# Patient Record
Sex: Female | Born: 1975 | Race: White | Hispanic: No | Marital: Single | Smoking: Never smoker
Health system: Southern US, Community
[De-identification: ages and names within clinical notes are randomized; demographics above are authoritative.]

## PROBLEM LIST (undated history)

## (undated) DIAGNOSIS — F419 Anxiety disorder, unspecified: Secondary | ICD-10-CM

## (undated) HISTORY — PX: FOOT SURGERY: SHX648

## (undated) HISTORY — PX: ABDOMINAL HYSTERECTOMY: SHX81

## (undated) HISTORY — PX: KNEE SURGERY: SHX244

---

## 2014-12-13 ENCOUNTER — Emergency Department (HOSPITAL_COMMUNITY)
Admission: EM | Admit: 2014-12-13 | Discharge: 2014-12-13 | Disposition: A | Discharge status: Home / Self Care | Payer: Self-pay | Attending: Emergency Medicine | Admitting: Emergency Medicine

## 2014-12-13 ENCOUNTER — Emergency Department (HOSPITAL_COMMUNITY): Payer: Self-pay

## 2014-12-13 ENCOUNTER — Encounter (HOSPITAL_COMMUNITY): Payer: Self-pay | Admitting: *Deleted

## 2014-12-13 DIAGNOSIS — F419 Anxiety disorder, unspecified: Secondary | ICD-10-CM | POA: Insufficient documentation

## 2014-12-13 DIAGNOSIS — R079 Chest pain, unspecified: Secondary | ICD-10-CM | POA: Insufficient documentation

## 2014-12-13 DIAGNOSIS — Z79899 Other long term (current) drug therapy: Secondary | ICD-10-CM | POA: Insufficient documentation

## 2014-12-13 HISTORY — DX: Anxiety disorder, unspecified: F41.9

## 2014-12-13 LAB — BASIC METABOLIC PANEL
Anion gap: 5 (ref 5–15)
BUN: 11 mg/dL (ref 6–20)
CHLORIDE: 109 mmol/L (ref 101–111)
CO2: 22 mmol/L (ref 22–32)
CREATININE: 0.71 mg/dL (ref 0.44–1.00)
Calcium: 8.1 mg/dL — ABNORMAL LOW (ref 8.9–10.3)
GFR calc non Af Amer: >60 mL/min (ref >60)
Glucose, Bld: 97 mg/dL (ref 65–99)
POTASSIUM: 5.2 mmol/L — AB (ref 3.5–5.1)
SODIUM: 136 mmol/L (ref 135–145)

## 2014-12-13 LAB — CBC
HEMATOCRIT: 37.4 % (ref 36.0–46.0)
Hemoglobin: 12.6 g/dL (ref 12.0–15.0)
MCH: 29.9 pg (ref 26.0–34.0)
MCHC: 33.7 g/dL (ref 30.0–36.0)
MCV: 88.6 fL (ref 78.0–100.0)
Platelets: 33 10*3/uL — ABNORMAL LOW (ref 150–400)
RBC: 4.22 MIL/uL (ref 3.87–5.11)
RDW: 12.7 % (ref 11.5–15.5)
WBC: 2.2 10*3/uL — AB (ref 4.0–10.5)

## 2014-12-13 LAB — I-STAT TROPONIN, ED: Troponin i, poc: 0 ng/mL (ref 0.00–0.08)

## 2014-12-13 MED ORDER — ACETAMINOPHEN 500 MG PO TABS
1000.0000 mg | ORAL_TABLET | Freq: Once | ORAL | Status: AC
  Administered 2014-12-13: 1000 mg via ORAL
  Filled 2014-12-13: qty 2

## 2014-12-13 MED ORDER — ASPIRIN 81 MG PO CHEW
324.0000 mg | CHEWABLE_TABLET | Freq: Once | ORAL | Status: AC
  Administered 2014-12-13: 324 mg via ORAL
  Filled 2014-12-13: qty 4

## 2014-12-13 NOTE — ED Provider Notes (Signed)
CSN: [id number]     Arrival date & time 12/13/14  [id number] History   First MD Initiated Contact with Patient 12/13/14 [id number]     Chief Complaint  Patient presents with  . Chest Pain     (Consider location/radiation/quality/duration/timing/severity/associated sxs/prior Treatment) Patient is a 39 y.o. female presenting with chest pain. The history is provided by the patient.  Chest Pain Pain location:  Substernal area and L chest Pain quality: pressure   Pain radiates to:  Does not radiate Pain radiates to the back: no   Pain severity:  Mild Onset quality:  Sudden Duration: 5. Timing:  Constant Progression:  Unchanged Chronicity:  New Context: at rest   Relieved by:  Nothing Worsened by:  Nothing tried Ineffective treatments:  None tried Associated symptoms: no abdominal pain, no cough, no fever and no shortness of breath   Risk factors: no high cholesterol, no hypertension and no smoking     Past Medical History  Diagnosis Date  . Anxiety    Past Surgical History  Procedure Laterality Date  . Foot surgery    . Knee surgery    . Abdominal hysterectomy    . Cesarean section     No family history on file. Social History  Substance Use Topics  . Smoking status: Never Smoker   . Smokeless tobacco: None  . Alcohol Use: No   OB History    No data available     Review of Systems  Constitutional: Negative for fever.  Respiratory: Negative for cough and shortness of breath.   Cardiovascular: Positive for chest pain.  Gastrointestinal: Negative for abdominal pain.  All other systems reviewed and are negative.     Allergies  Morphine and related and Prednisone  Home Medications   Prior to Admission medications   Medication Sig Start Date End Date Taking? Authorizing Provider  alprazolam Prudy Feeler(XANAX) 2 MG tablet Take 2 mg by mouth 2 (two) times daily as needed for sleep or anxiety.   Yes Historical Provider, MD  Asenapine Maleate (SAPHRIS) 10 MG SUBL Place 1 tablet under  the tongue daily.   Yes Historical Provider, MD  lamoTRIgine (LAMICTAL) 100 MG tablet Take 100 mg by mouth daily.   Yes Historical Provider, MD   BP 115/67 mmHg  Pulse 95  Temp(Src) 97.6 F (36.4 C) (Oral)  Resp 18  SpO2 99% Physical Exam  Constitutional: She is oriented to person, place, and time. She appears well-developed and well-nourished. No distress.  HENT:  Head: Normocephalic.  Eyes: Conjunctivae are normal.  Neck: Neck supple. No tracheal deviation present.  Cardiovascular: Normal rate, regular rhythm and normal heart sounds.   Pulmonary/Chest: Effort normal and breath sounds normal. No respiratory distress. She has no wheezes. She has no rales. She exhibits tenderness.  Abdominal: Soft. She exhibits no distension.  Neurological: She is alert and oriented to person, place, and time.  Skin: Skin is warm and dry.  Psychiatric: She has a normal mood and affect.    ED Course  Procedures (including critical care time) Labs Review Labs Reviewed  BASIC METABOLIC PANEL - Abnormal; Notable for the following:    Potassium 5.2 (*)    Calcium 8.1 (*)    All other components within normal limits  CBC - Abnormal; Notable for the following:    WBC 2.2 (*)    Platelets 33 (*)    All other components within normal limits  Rosezena SensorI-STAT TROPOININ, ED    Imaging Review Dg Chest 2 View  12/13/2014  CLINICAL DATA:  Mid sternal chest pain. EXAM: CHEST - 2 VIEW COMPARISON:  None FINDINGS: The heart size and mediastinal contours are within normal limits. There is no evidence of pulmonary edema, consolidation, pneumothorax, nodule or pleural fluid. The visualized skeletal structures are unremarkable. IMPRESSION: No active disease. Electronically Signed   By: Irish Lack M.D.   On: 12/13/2014 07:46   I have personally reviewed and evaluated these images and lab results as part of my medical decision-making.   EKG Interpretation   Date/Time:  Friday December 13 2014 06:56:52  EDT Ventricular Rate:  93 PR Interval:  125 QRS Duration: 88 QT Interval:  365 QTC Calculation: 454 R Axis:   78 Text Interpretation:  Sinus rhythm No old tracing to compare Confirmed by  Mirian Mo [phone]) on 12/13/2014 7:03:37 AM      MDM   Final diagnoses:  Chest pain, unspecified chest pain type    39 year old female presents with left sided atypical, reproducible chest pain that she woke up with at 3 AM today. She is PERC, negative, heart score is 1 and troponin is negative. Doubt ACS. Patient asking about refills of medication. No emergent indication currently for Xanax or Lamictal. [facility] for [facility] care.    Lyndal Pulley, MD 12/13/14 [phone]

## 2014-12-13 NOTE — Discharge Instructions (Signed)
Chest Wall Pain °Chest wall pain is pain in or around the bones and muscles of your chest. Sometimes, an injury causes this pain. Sometimes, the cause may not be known. This pain may take several weeks or longer to get better. °HOME CARE °Pay attention to any changes in your symptoms. Take these actions to help with your pain: °· Rest as told by your doctor. °· Avoid activities that cause pain. Try not to use your chest, belly (abdominal), or side muscles to lift heavy things. °· If directed, apply ice to the painful area: °¨ Put ice in a plastic bag. °¨ Place a towel between your skin and the bag. °¨ Leave the ice on for 20 minutes, 2-3 times per day. °· Take over-the-counter and prescription medicines only as told by your doctor. °· Do not use tobacco products, including cigarettes, chewing tobacco, and e-cigarettes. If you need help quitting, ask your doctor. °· Keep all follow-up visits as told by your doctor. This is important. °GET HELP IF: °· You have a fever. °· Your chest pain gets worse. °· You have new symptoms. °GET HELP RIGHT AWAY IF: °· You feel sick to your stomach (nauseous) or you throw up (vomit). °· You feel sweaty or light-headed. °· You have a cough with phlegm (sputum) or you cough up blood. °· You are short of breath. °  °This information is not intended to replace advice given to you by your health care provider. Make sure you discuss any questions you have with your health care provider. °  °Document Released: 07/28/2007 Document Revised: 10/30/2014 Document Reviewed: 05/06/2014 °Elsevier Interactive Patient Education ©2016 [employer] ° °

## 2014-12-13 NOTE — ED Notes (Signed)
Pt states that she woke up at 3am with midsternal to left sided chest pain; pt describes the feeling as a squeezing sensation; pt states that she feels dizzy and has a headache but denies any other sx; pt states that she has been anxious but has never been awakened from sleep with pain; denies shortness of breath; pt hyperventilating at times in triage

## 2015-02-18 ENCOUNTER — Emergency Department (HOSPITAL_COMMUNITY)
Admission: EM | Admit: 2015-02-18 | Discharge: 2015-02-18 | Disposition: A | Discharge status: Home / Self Care | Payer: Self-pay

## 2015-02-18 NOTE — ED Notes (Signed)
No answer x3 in waiting room.

## 2015-02-18 NOTE — ED Notes (Signed)
Unable to locate pt. at triage/waiting area .

## 2015-02-19 ENCOUNTER — Encounter (HOSPITAL_COMMUNITY): Payer: Self-pay | Admitting: Emergency Medicine

## 2015-02-19 ENCOUNTER — Emergency Department (HOSPITAL_COMMUNITY)
Admission: EM | Admit: 2015-02-19 | Discharge: 2015-02-19 | Disposition: A | Discharge status: Home / Self Care | Payer: Self-pay | Attending: Emergency Medicine | Admitting: Emergency Medicine

## 2015-02-19 DIAGNOSIS — Z79899 Other long term (current) drug therapy: Secondary | ICD-10-CM | POA: Insufficient documentation

## 2015-02-19 DIAGNOSIS — M545 Low back pain, unspecified: Secondary | ICD-10-CM

## 2015-02-19 DIAGNOSIS — F419 Anxiety disorder, unspecified: Secondary | ICD-10-CM | POA: Insufficient documentation

## 2015-02-19 DIAGNOSIS — M25551 Pain in right hip: Secondary | ICD-10-CM | POA: Insufficient documentation

## 2015-02-19 MED ORDER — KETOROLAC TROMETHAMINE 60 MG/2ML IM SOLN
60.0000 mg | Freq: Once | INTRAMUSCULAR | Status: AC
Start: 2015-02-19 — End: 2015-02-19
  Administered 2015-02-19: 60 mg via INTRAMUSCULAR
  Filled 2015-02-19: qty 2

## 2015-02-19 MED ORDER — DEXAMETHASONE SODIUM PHOSPHATE 10 MG/ML IJ SOLN
10.0000 mg | Freq: Once | INTRAMUSCULAR | Status: AC
Start: 2015-02-19 — End: 2015-02-19
  Administered 2015-02-19: 10 mg via INTRAMUSCULAR
  Filled 2015-02-19: qty 1

## 2015-02-19 NOTE — ED Notes (Signed)
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 Has had LBP since then. Radiates down right leg.

## 2015-02-19 NOTE — ED Provider Notes (Signed)
CSN: [id number]     Arrival date & time 02/19/15  1442 History  By signing my name below, I, Phillis HaggisGabriella Gaje, attest that this documentation has been prepared under the direction and in the presence of Joycie PeekBenjamin Duston Smolenski, PA-C Electronically Signed: Phillis HaggisGabriella Gaje, ED Scribe. 02/19/2015. 4:24 PM.   No chief complaint on file. The history is provided by the patient. No language interpreter was used.   HPI Comments: Tracey PatersonRoxanne Moreno is a 39 y.o. female who presents to the Emergency Department complaining of dull, burning, constant, gradually worsening lower back and right gluteal pain onset 2 days ago. Pt states that the pain has been radiating down her right leg and has a hx of sciatic nerve pain. 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 She reports worsening pain with movement. She states that she has been taking Tylenol and Aleve to no relief. She denies bladder or bowel incontinence, numbness, or weakness. No fevers, chills, night sweats, IV drug use. Pt reports allergies to oral prednisone; she states that it causes her BP to elevate.    Past Medical History  Diagnosis Date  . Anxiety    Past Surgical History  Procedure Laterality Date  . Foot surgery    . Knee surgery    . Abdominal hysterectomy    . Cesarean section     No family history on file. Social History  Substance Use Topics  . Smoking status: Never Smoker   . Smokeless tobacco: None  . Alcohol Use: No   OB History    No data available     Review of Systems  Musculoskeletal: Positive for back pain and arthralgias.  Neurological: Negative for weakness and numbness.  All other systems reviewed and are negative.  Allergies  Morphine and related and Prednisone  Home Medications   Prior to Admission medications   Medication Sig Start Date End Date Taking? Authorizing Provider  alprazolam Prudy Feeler(XANAX) 2 MG tablet Take 2 mg by mouth 2 (two) times daily as needed for sleep or anxiety.     Historical Provider, MD  Asenapine Maleate (SAPHRIS) 10 MG SUBL Place 1 tablet under the tongue daily.    Historical Provider, MD  lamoTRIgine (LAMICTAL) 100 MG tablet Take 100 mg by mouth daily.    Historical Provider, MD   BP 110/71 mmHg  Pulse 105  Temp(Src) 97.9 F (36.6 C) (Oral)  Resp 20  Ht 5\' 4"  (1.626 m)  Wt 72.576 kg  BMI 27.45 kg/m2  SpO2 100% Physical Exam  Constitutional: She is oriented to person, place, and time. She appears well-developed and well-nourished. No distress.  HENT:  Head: Normocephalic and atraumatic.  Eyes: EOM are normal.  Neck: Normal range of motion.  Cardiovascular: Normal rate, regular rhythm and normal heart sounds.   Pulmonary/Chest: Effort normal and breath sounds normal.  Abdominal: Soft. She exhibits no distension. There is no tenderness.  Musculoskeletal: Normal range of motion.  Tenderness over superior gluteal musculature and right hip bursa. No other lesions or deformities, redness or swelling.  Neurological: She is alert and oriented to person, place, and time.  Gait is somewhat antalgic with no ataxia; motor strength 5/5 in all extremities; sensation intact to light touch in all extremities. Negative straight leg raise.  Skin: Skin is warm and dry.  Psychiatric: She has a normal mood and affect. Judgment normal.  Nursing note and vitals reviewed.   ED Course  Procedures (including critical care time) DIAGNOSTIC STUDIES: Oxygen Saturation is 100%  on RA, normal by my interpretation.    COORDINATION OF CARE: 4:24 PM-Discussed treatment plan which includes anti-inflammatories with pt at bedside and pt agreed to plan.    Labs Review Labs Reviewed - No data to display  Imaging Review No results found. I have personally reviewed and evaluated these images and lab results as part of my medical decision-making.   EKG Interpretation None      Medications  ketorolac (TORADOL) injection 60 mg (60 mg Intramuscular Given 02/19/15  1633)  dexamethasone (DECADRON) injection 10 mg (10 mg Intramuscular Given 02/19/15 1633)   Filed Vitals:   02/19/15 1453  BP: 110/71  Pulse: 105  Temp: 97.9 F (36.6 C)  Resp: 20    MDM  Tracey Moreno is a 38 y.o. female Patient with back pain.  No neurological deficits and normal neuro exam.  Patient can walk but states is painful.  No loss of bowel or bladder control.  No concern for cauda equina. Likely lumbosacral strain versus bursitis. Treated in the ED with steroid injection, and said. No fever, night sweats, weight loss, h/o cancer, IVDU.  RICE protocol as well as continued use of Aleve at home discussed with patient. Patient verbalizes understanding and agrees with this plan. Discussed follow-up with PCP as needed.  Overall, patient is well, nontoxic, hemodynamically stable and appropriate for discharge. There is no tachycardia on my exam, heart rate was mid 90s. Suspect original heart rate was due to activity versus pain.  The patient appears reasonably screened and/or stabilized for discharge and I doubt any other medical condition or other [facility] requiring further screening, evaluation, or treatment in the ED at this time prior to discharge.    Final diagnoses:  Right-sided low back pain without sciatica    I personally performed the services described in this documentation, which was scribed in my presence. The recorded information has been reviewed and is accurate.    Joycie Peek, PA-C 02/19/15 1702  Rolland Porter, MD 02/28/15 [phone]

## 2015-02-19 NOTE — Discharge Instructions (Signed)
Please take your medications as prescribed. Her symptoms should improve over the next couple of weeks. Please follow-up with your doctor for reevaluation as needed. Continue using your Aleve as we discussed. Return to ED for any new or worsening symptoms including loss of bowel or bladder function, numbness or weakness.  Back Pain, Adult Back pain is very common in adults.The cause of back pain is rarely dangerous and the pain often gets better over time.The cause of your back pain may not be known. Some common causes of back pain include:  Strain of the muscles or ligaments supporting the spine.  Wear and tear (degeneration) of the spinal disks.  Arthritis.  Direct injury to the back. For many people, back pain may return. Since back pain is rarely dangerous, most people can learn to manage this condition on their own. HOME CARE INSTRUCTIONS Watch your back pain for any changes. The following actions may help to lessen any discomfort you are feeling:  Remain active. It is stressful on your back to sit or stand in one place for long periods of time. Do not sit, drive, or stand in one place for more than 30 minutes at a time. Take short walks on even surfaces as soon as you are able.Try to increase the length of time you walk each day.  Exercise regularly as directed by your health care provider. Exercise helps your back heal faster. It also helps avoid future injury by keeping your muscles strong and flexible.  Do not stay in bed.Resting more than 1-2 days can delay your recovery.  Pay attention to your body when you bend and lift. The most comfortable positions are those that put less stress on your recovering back. Always use proper lifting techniques, including:  Bending your knees.  Keeping the load close to your body.  Avoiding twisting.  Find a comfortable position to sleep. Use a firm mattress and lie on your side with your knees slightly bent. If you lie on your back, put a  pillow under your knees.  Avoid feeling anxious or stressed.Stress increases muscle tension and can worsen back pain.It is important to recognize when you are anxious or stressed and learn ways to manage it, such as with exercise.  Take medicines only as directed by your health care provider. Over-the-counter medicines to reduce pain and inflammation are often the most helpful.Your health care provider may prescribe muscle relaxant drugs.These medicines help dull your pain so you can more quickly return to your normal activities and healthy exercise.  Apply ice to the injured area:  Put ice in a plastic bag.  Place a towel between your skin and the bag.  Leave the ice on for 20 minutes, 2-3 times a day for the first 2-3 days. After that, ice and heat may be alternated to reduce pain and spasms.  Maintain a healthy weight. Excess weight puts extra stress on your back and makes it difficult to maintain good posture. SEEK MEDICAL CARE IF:  You have pain that is not relieved with rest or medicine.  You have increasing pain going down into the legs or buttocks.  You have pain that does not improve in one week.  You have night pain.  You lose weight.  You have a fever or chills. SEEK IMMEDIATE MEDICAL CARE IF:   You develop new bowel or bladder control problems.  You have unusual weakness or numbness in your arms or legs.  You develop nausea or vomiting.  You develop abdominal pain.  You feel faint.   This information is not intended to replace advice given to you by your health care provider. Make sure you discuss any questions you have with your health care provider.   Document Released: 02/08/2005 Document Revised: 03/01/2014 Document Reviewed: 06/12/2013 Elsevier Interactive Patient Education [employer].

## 2015-04-16 ENCOUNTER — Encounter (HOSPITAL_COMMUNITY): Payer: Self-pay | Admitting: *Deleted

## 2015-04-16 ENCOUNTER — Emergency Department (HOSPITAL_COMMUNITY)
Admission: EM | Admit: 2015-04-16 | Discharge: 2015-04-16 | Disposition: A | Discharge status: Home / Self Care | Payer: Self-pay | Attending: Emergency Medicine | Admitting: Emergency Medicine

## 2015-04-16 DIAGNOSIS — Z79899 Other long term (current) drug therapy: Secondary | ICD-10-CM | POA: Insufficient documentation

## 2015-04-16 DIAGNOSIS — M25561 Pain in right knee: Secondary | ICD-10-CM | POA: Insufficient documentation

## 2015-04-16 DIAGNOSIS — F419 Anxiety disorder, unspecified: Secondary | ICD-10-CM | POA: Insufficient documentation

## 2015-04-16 DIAGNOSIS — R21 Rash and other nonspecific skin eruption: Secondary | ICD-10-CM | POA: Insufficient documentation

## 2015-04-16 MED ORDER — PERMETHRIN 5 % EX CREA: TOPICAL_CREAM | CUTANEOUS | Status: AC

## 2015-04-16 MED ORDER — KETOROLAC TROMETHAMINE 60 MG/2ML IM SOLN
60.0000 mg | Freq: Once | INTRAMUSCULAR | Status: AC
  Administered 2015-04-16: 60 mg via INTRAMUSCULAR
  Filled 2015-04-16: qty 2

## 2015-04-16 MED ORDER — IBUPROFEN 800 MG PO TABS: 800.0000 mg | ORAL_TABLET | Freq: Three times a day (TID) | ORAL | Status: AC

## 2015-04-16 NOTE — ED Notes (Signed)
Pt placed into shorts 

## 2015-04-16 NOTE — ED Notes (Signed)
C/o rash onset 3 days ago statesw her husband was treated for same and c/o knee pain onset yest.

## 2015-04-16 NOTE — ED Provider Notes (Signed)
CSN: [id number]     Arrival date & time 04/16/15  4098 History   First MD Initiated Contact with Patient 04/16/15 [phone]     Chief Complaint  Patient presents with  . Rash  . Knee Pain     (Consider location/radiation/quality/duration/timing/severity/associated sxs/prior Treatment) HPI Comments: The patient is a 40 year old female, she has a history of chronic right patellar dislocations, states that she underwent surgery over one [place] area. She has recently moved here, she states that she was squatting, she tried to stand up, had some pain in the right knee with some deformity. This reduced spontaneously. She also states that she was walking down a ladder and had similar symptoms. At this time she states that her knee appears normal but has pain radiating from the knee to the foot. This is an intermittently chronic problem for her. She also complains of a diffuse itchy rash similar to what her significant other has, she is unsure what it is but is concerned about scabies. His been present for 2 days.  Patient is a 40 y.o. female presenting with rash and knee pain. The history is provided by the patient.  Rash Associated symptoms: joint pain   Knee Pain   Past Medical History  Diagnosis Date  . Anxiety    Past Surgical History  Procedure Laterality Date  . Foot surgery    . Knee surgery    . Abdominal hysterectomy    . Cesarean section     No family history on file. Social History  Substance Use Topics  . Smoking status: Never Smoker   . Smokeless tobacco: None  . Alcohol Use: No   OB History    No data available     Review of Systems  Musculoskeletal: Positive for arthralgias.  Skin: Positive for rash.      Allergies  Morphine and related and Prednisone  Home Medications   Prior to Admission medications   Medication Sig Start Date End Date Taking? Authorizing Provider  alprazolam Prudy Feeler) 2 MG tablet Take 2 mg by mouth 2 (two) times daily as  needed for sleep or anxiety.    Historical Provider, MD  Asenapine Maleate (SAPHRIS) 10 MG SUBL Place 1 tablet under the tongue daily.    Historical Provider, MD  ibuprofen (ADVIL,MOTRIN) 800 MG tablet Take 1 tablet (800 mg total) by mouth 3 (three) times daily. 04/16/15   Eber Hong, MD  lamoTRIgine (LAMICTAL) 100 MG tablet Take 100 mg by mouth daily.    Historical Provider, MD  permethrin (ELIMITE) 5 % cream Apply to entire body other than face - let sit for 14 hours then wash off, may repeat in 1 week if still having symptoms 04/16/15   Eber Hong, MD   BP 123/80 mmHg  Pulse 92  Temp(Src) 98 F (36.7 C) (Oral)  Resp 18  SpO2 100% Physical Exam  Constitutional: She appears well-developed and well-nourished.  HENT:  Head: Normocephalic and atraumatic.  Eyes: Conjunctivae are normal. Right eye exhibits no discharge. Left eye exhibits no discharge.  Pulmonary/Chest: Effort normal. No respiratory distress.  Musculoskeletal:  Full range of motion of the right knee, joint is supple, patellar seems to be midline, no pain at this time, full range motion of the ankle as well. She has normal strength in all major muscle groups of the right lower extremity.  Neurological: She is alert. Coordination normal.  Skin: Skin is warm and dry. Rash (diffuse scabbed over rash which  she states is pruritic, no signs of cellulitis petechiae or purpura) noted. She is not diaphoretic. No erythema.  Psychiatric: She has a normal mood and affect.  Nursing note and vitals reviewed.   ED Course  Procedures (including critical care time) Labs Review Labs Reviewed - No data to display  Imaging Review No results found. I have personally reviewed and evaluated these images and lab results as part of my medical decision-making.   EKG Interpretation None      MDM   Final diagnoses:  Rash  Knee pain, acute, right    Despite the finding of no intertriginous rash I suspect that she does have scabies  given her significant other with similar symptoms. She has chronic patellar dislocation, there is no indication for imaging at this time, she appears to have normal anatomy, she will be immobilized in a long-leg immobilizer, anti-inflammatories, home with orthopedic follow-up, she has expressed her understanding to the verbal instructions and is stable for discharge.  Meds given in ED:  Medications  ketorolac (TORADOL) injection 60 mg (60 mg Intramuscular Given 04/16/15 0823)    New Prescriptions   IBUPROFEN (ADVIL,MOTRIN) 800 MG TABLET    Take 1 tablet (800 mg total) by mouth 3 (three) times daily.   PERMETHRIN (ELIMITE) 5 % CREAM    Apply to entire body other than face - let sit for 14 hours then wash off, may repeat in 1 week if still having symptoms        Eber Hong, MD 04/16/15 [phone]

## 2015-04-16 NOTE — Discharge Instructions (Signed)
Apply the cream to your body today - leave on for 12 hours, then wash off

## 2015-06-07 DIAGNOSIS — R51 Headache: Secondary | ICD-10-CM | POA: Insufficient documentation

## 2015-06-08 ENCOUNTER — Emergency Department (HOSPITAL_COMMUNITY)
Admission: EM | Admit: 2015-06-08 | Discharge: 2015-06-08 | Disposition: A | Discharge status: Home / Self Care | Payer: Self-pay | Attending: Emergency Medicine | Admitting: Emergency Medicine

## 2015-06-08 ENCOUNTER — Emergency Department (HOSPITAL_COMMUNITY): Payer: Self-pay

## 2015-06-08 ENCOUNTER — Encounter (HOSPITAL_COMMUNITY): Payer: Self-pay | Admitting: Emergency Medicine

## 2015-06-08 NOTE — ED Notes (Signed)
Pt called from lobby to go to a treatment room, may be in [place]

## 2015-06-08 NOTE — ED Notes (Signed)
Pt c/o severe HA onset tonight @ 1800, +  Nausea. Pt states she does not normally have HA

## 2015-06-08 NOTE — ED Notes (Signed)
No answer x1

## 2015-06-29 ENCOUNTER — Encounter (HOSPITAL_COMMUNITY): Payer: Self-pay | Admitting: *Deleted

## 2015-06-29 ENCOUNTER — Emergency Department (HOSPITAL_COMMUNITY)
Admission: EM | Admit: 2015-06-29 | Discharge: 2015-06-29 | Disposition: A | Discharge status: Home / Self Care | Payer: Self-pay | Attending: Emergency Medicine | Admitting: Emergency Medicine

## 2015-06-29 ENCOUNTER — Emergency Department (HOSPITAL_COMMUNITY): Payer: Self-pay

## 2015-06-29 DIAGNOSIS — Y9389 Activity, other specified: Secondary | ICD-10-CM | POA: Insufficient documentation

## 2015-06-29 DIAGNOSIS — Y9289 Other specified places as the place of occurrence of the external cause: Secondary | ICD-10-CM | POA: Insufficient documentation

## 2015-06-29 DIAGNOSIS — F419 Anxiety disorder, unspecified: Secondary | ICD-10-CM | POA: Insufficient documentation

## 2015-06-29 DIAGNOSIS — Z79899 Other long term (current) drug therapy: Secondary | ICD-10-CM | POA: Insufficient documentation

## 2015-06-29 DIAGNOSIS — Y998 Other external cause status: Secondary | ICD-10-CM | POA: Insufficient documentation

## 2015-06-29 DIAGNOSIS — S8991XA Unspecified injury of right lower leg, initial encounter: Secondary | ICD-10-CM | POA: Insufficient documentation

## 2015-06-29 DIAGNOSIS — X501XXA Overexertion from prolonged static or awkward postures, initial encounter: Secondary | ICD-10-CM | POA: Insufficient documentation

## 2015-06-29 DIAGNOSIS — M25561 Pain in right knee: Secondary | ICD-10-CM

## 2015-06-29 MED ORDER — ACETAMINOPHEN 500 MG PO TABS
1000.0000 mg | ORAL_TABLET | Freq: Once | ORAL | Status: AC
  Administered 2015-06-29: 1000 mg via ORAL
  Filled 2015-06-29: qty 2

## 2015-06-29 NOTE — Discharge Instructions (Signed)
Community Resource Guide Social Services °The [employer]’s “211” is a great source of information about community services available.  Access by dialing 2-1-1 from anywhere in [place], or by website -  www.nc211.org.  ° °Other Local Resources (Updated 02/2015) ° °Social  °Services °  °Services °Offered  °Phone Number and Address  °Aging, Disability and Transit Services • In-home services °• Meals on wheels °• Community meal sites °• Transportation services °• Adult day health care °• [facility] °• Family caregiver support services 336-394-1310 °[place], [place]  °[place] [facility] • Aging and adult services °• Children’s services °• Deaf-Blind services °• Disability services °• Guardianship °• Hearing loss (assistive technology, interpreters, etc.) °• Low-income services (health care, child care, housing, financial and nutrition assistance) °• Medicaid °• Pregnancy services °• Utility assistance °• Veteran’s services 336-570-6532 °[address] °[place], [place] [address] °  °[facility] • Assessment °• Care management °• Family education °• Information and referral 336-538-8080 °[address] °[place], [place] [address]  °[facility] • Aging and adult services °• Children’s services °• Deaf-Blind services °• Disability services °• Guardianship °• Hearing loss (assistive technology, interpreters, etc.) °• Low-income services (health care, child care, housing, financial and nutrition assistance) °• Medicaid °• Pregnancy services °• Utility assistance °• Veteran’s services 336-694-4141 °[address] °[place], [place] [address]  °[place] [facility] • Aging and adult services °• Children’s services °• Deaf-Blind services °• Disability services °• Guardianship °• Hearing loss (assistive technology, interpreters, etc.) °• Low-income services (health care, child care, housing, financial and nutrition  assistance) °• Medicaid °• Pregnancy services °• Utility assistance °• Veteran’s services 336-703-3800 °[address] °[place], [place] [address]  °[place] [facility] • Aging and adult services °• Children’s services °• Deaf-Blind services °• Disability services °• Guardianship °• Hearing loss (assistive technology, interpreters, etc.) °• Low-income services (health care, child care, housing, financial and nutrition assistance) °• Medicaid °• Pregnancy services °• Utility assistance °• Veteran’s services 336-641-3000 °[address] °[place], [place] [address] °  °[place] [facility] • Aging and adult services °• Children’s services °• Deaf-Blind services °• Disability services °• Guardianship °• Hearing loss (assistive technology, interpreters, etc.) °• Low-income services (health care, child care, housing, financial and nutrition assistance) °• Medicaid °• Pregnancy services °• Utility assistance °• Veteran’s services 336-683-8000 °[address], °[place], [place] [address]  °[place] [facility] • Aging and adult services °• Children’s services °• Deaf-Blind services °• Disability services °• Guardianship °• Hearing loss (assistive technology, interpreters, etc.) °• Low-income services (health care, child care, housing, financial and nutrition assistance) °• Medicaid °• Pregnancy services °• Utility assistance °• Veteran’s services [phone] °[address] °[place], [place] [address]  °[facility] • Serves adults age 60 and over and their families °• Caregiver information °• Foster grandparents °• Grandparents Raising Grandchildren °• Mobile meals °• Refugee programs °• Retired & Senior Volunteer Program (RSVP) °• Rural Outreach °• Senior Center °• Seniors’ Health Insurance Information Program (SHIIP) °• Senior Wheels Medical Transportation Program °• SeniorLine °• Support Groups °• TeleCare  336-373-4816 ° °[address] °[place], [place] [address] ° °336-884-6981 ° °[address] °[place],  [address]  °[facility] • Help Line °• Home Care °• Living at Home °• Meals on Wheels °• Senior Lunch Program °• Adult Day Center °• Equipment Loan Closet °•   Support Groups °• Care Partner Workshops °• Referrals for chore and homemaker services 336-724-2040 ° °[address] °[place], [place] [address]  °[facility] [employer] • Crisis assistance °• Medication assistance °• Rental assistance °• Food pantry °• Medication assistance °• Housing assistance °• Emergency food distribution °• Utility assistance °• [employer] [facility] [employer] 336-222-5529 °[address] °[place], [place] ° ° °[phone] (Tuesdays and Thursdays from 9am - 12 noon by appointment only) °[address] °, [place] [address] ° °[phone] °[address] °[place], [place] [address]  ° ° ° °

## 2015-06-29 NOTE — ED Notes (Signed)
Pt left without discharge instructions and without signing e-signature. Pt left without letting staff know or getting discharge vitals.

## 2015-06-29 NOTE — ED Provider Notes (Signed)
CSN: [id number]     Arrival date & time 06/29/15  0141 History   By signing my name below, I, Arlan OrganAshley Leger, attest that this documentation has been prepared under the direction and in the presence of Azalia BilisKevin Shylo Zamor, MD.  Electronically Signed: Arlan OrganAshley Leger, ED Scribe. 06/29/2015. 2:55 AM.   Chief Complaint  Patient presents with  . Knee Pain  . Foot Pain   The history is provided by the patient. No language interpreter was used.    HPI Comments: Tracey Moreno is a 40 y.o. female without any pertinent past medical history who presents to the Emergency Department complaining of intermittent, ongoing, recurrent R knee pain x 2-3 days. No recent trauma, falls, or injury. However, pt states she occasionally "pops her knee back into place". She also reports L foot pain. No OTC medications or home remedies attempted prior to arrival. No recent fever, chills, nausea, vomiting, or abdominal pain. No numbness, loss of sensation, or weakness. Pt states [facility] but states she no longer has insurance and cannot cover the costs.  PCP: No primary care provider on file.    Past Medical History  Diagnosis Date  . Anxiety    Past Surgical History  Procedure Laterality Date  . Foot surgery    . Knee surgery    . Abdominal hysterectomy    . Cesarean section     No family history on file. Social History  Substance Use Topics  . Smoking status: Never Smoker   . Smokeless tobacco: Never Used  . Alcohol Use: No   OB History    No data available     Review of Systems  Constitutional: Negative for fever and chills.  Respiratory: Negative for cough and shortness of breath.   Gastrointestinal: Negative for nausea, vomiting and abdominal pain.  Musculoskeletal: Positive for arthralgias.  All other systems reviewed and are negative.     Allergies  Morphine and related and Prednisone  Home Medications   Prior to Admission medications   Medication Sig  Start Date End Date Taking? Authorizing Provider  alprazolam Prudy Feeler(XANAX) 2 MG tablet Take 2 mg by mouth 2 (two) times daily as needed for sleep or anxiety.    Historical Provider, MD  Asenapine Maleate (SAPHRIS) 10 MG SUBL Place 1 tablet under the tongue daily.    Historical Provider, MD  ibuprofen (ADVIL,MOTRIN) 800 MG tablet Take 1 tablet (800 mg total) by mouth 3 (three) times daily. 04/16/15   Eber HongBrian Miller, MD  lamoTRIgine (LAMICTAL) 100 MG tablet Take 100 mg by mouth daily.    Historical Provider, MD  permethrin (ELIMITE) 5 % cream Apply to entire body other than face - let sit for 14 hours then wash off, may repeat in 1 week if still having symptoms 04/16/15   Eber HongBrian Miller, MD   Triage Vitals: BP 121/82 mmHg  Pulse 101  Temp(Src) 98.1 F (36.7 C) (Oral)  Resp 20  Ht 5\' 4"  (1.626 m)  Wt 165 lb (74.844 kg)  BMI 28.31 kg/m2  SpO2 97%   Physical Exam  Constitutional: She is oriented to person, place, and time. She appears well-developed and well-nourished.  HENT:  Head: Normocephalic.  Eyes: EOM are normal.  Neck: Normal range of motion.  Pulmonary/Chest: Effort normal.  Abdominal: She exhibits no distension.  Musculoskeletal: Normal range of motion.  Small joint effusion to R knee FROM of R knee No erythema of R knee Normal pulses to R foot  Neurological:  She is alert and oriented to person, place, and time.  Psychiatric: She has a normal mood and affect.  Nursing note and vitals reviewed.   ED Course  Procedures (including critical care time)  DIAGNOSTIC STUDIES: Oxygen Saturation is 97% on RA, Normal by my interpretation.    COORDINATION OF CARE: 2:50 AM- Will order imaging. Will give Tylenol. Discussed treatment plan with pt at bedside and pt agreed to plan.     Labs Review Labs Reviewed - No data to display  Imaging Review Dg Knee Complete 4 Views Right  06/29/2015  CLINICAL DATA:  Ladder injury. EXAM: RIGHT KNEE - COMPLETE 4+ VIEW COMPARISON:  None. FINDINGS:  Negative for acute fracture dislocation. There is a knee joint effusion. There is no bone lesion or bony destruction. No significant arthritic changes are evident. IMPRESSION: Joint effusion.  Negative for acute fracture. Electronically Signed   By: Ellery Plunk M.D.   On: 06/29/2015 03:41   I have personally reviewed and evaluated these images and lab results as part of my medical decision-making.   EKG Interpretation None      MDM   Final diagnoses:  Right knee pain    Recurrent right knee pain.  Patient will need outpatient orthopedic follow-up.  She also need a primary care physician.  Home with anti-inflammatories and ice.  I personally performed the services described in this documentation, which was scribed in my presence. The recorded information has been reviewed and is accurate.       Azalia Bilis, MD 06/29/15 [phone]

## 2015-06-29 NOTE — ED Notes (Signed)
Patient stated her right knee locks up and Saturday when getting off the ladder felt like her knee locked up (history of the same).  Usually can get it to go back in but not this time and someone helped her and she stated it felt like her knee broke and is also c/o left foot pain  Knee appears swollen

## 2016-03-17 ENCOUNTER — Other Ambulatory Visit: Payer: Self-pay | Admitting: Obstetrics and Gynecology

## 2016-03-17 DIAGNOSIS — Z1231 Encounter for screening mammogram for malignant neoplasm of breast: Secondary | ICD-10-CM

## 2016-04-08 ENCOUNTER — Ambulatory Visit (HOSPITAL_COMMUNITY): Payer: Self-pay

## 2016-08-09 IMAGING — DX DG KNEE COMPLETE 4+V*R*
4 series · 4 of 4 positions shown · non-contrast
Comparison: None.

CLINICAL DATA: Ladder injury.

EXAM:
RIGHT KNEE - COMPLETE 4+ VIEW

[knee ap]
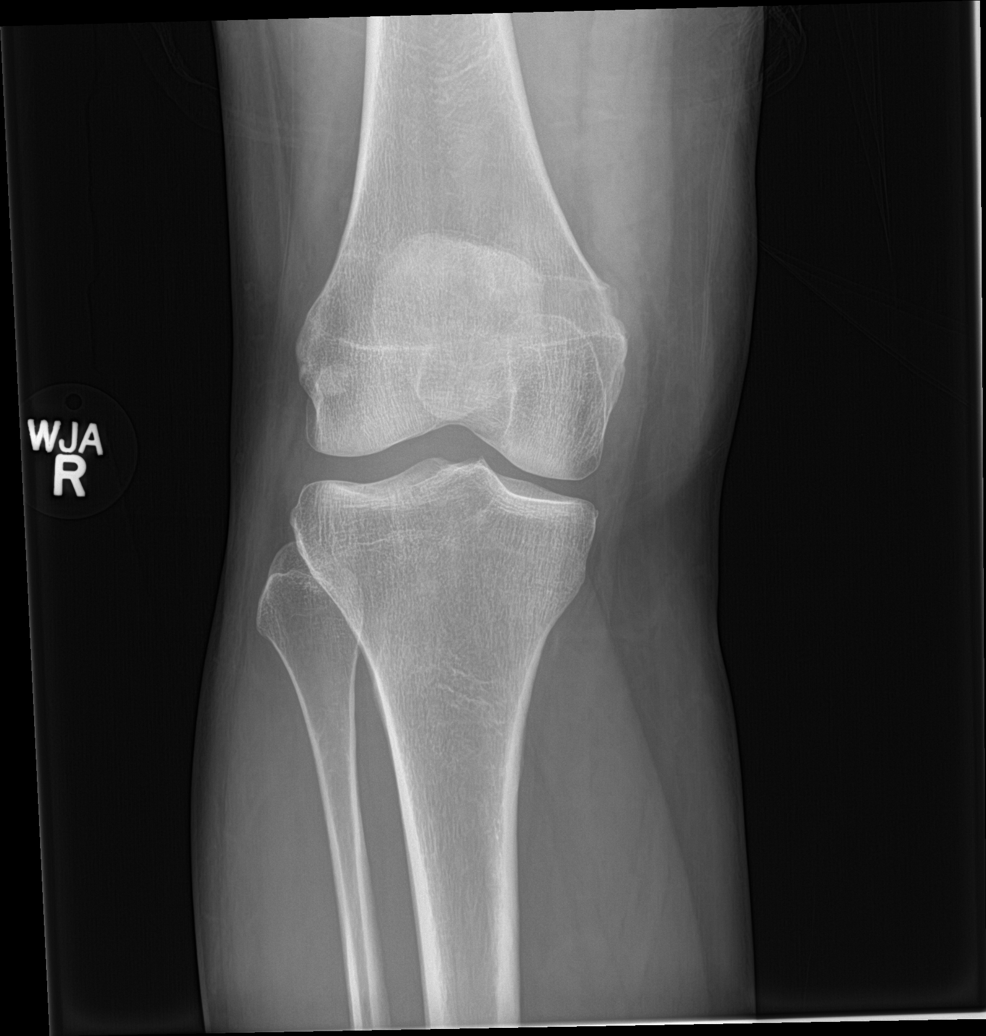

[knee lat]
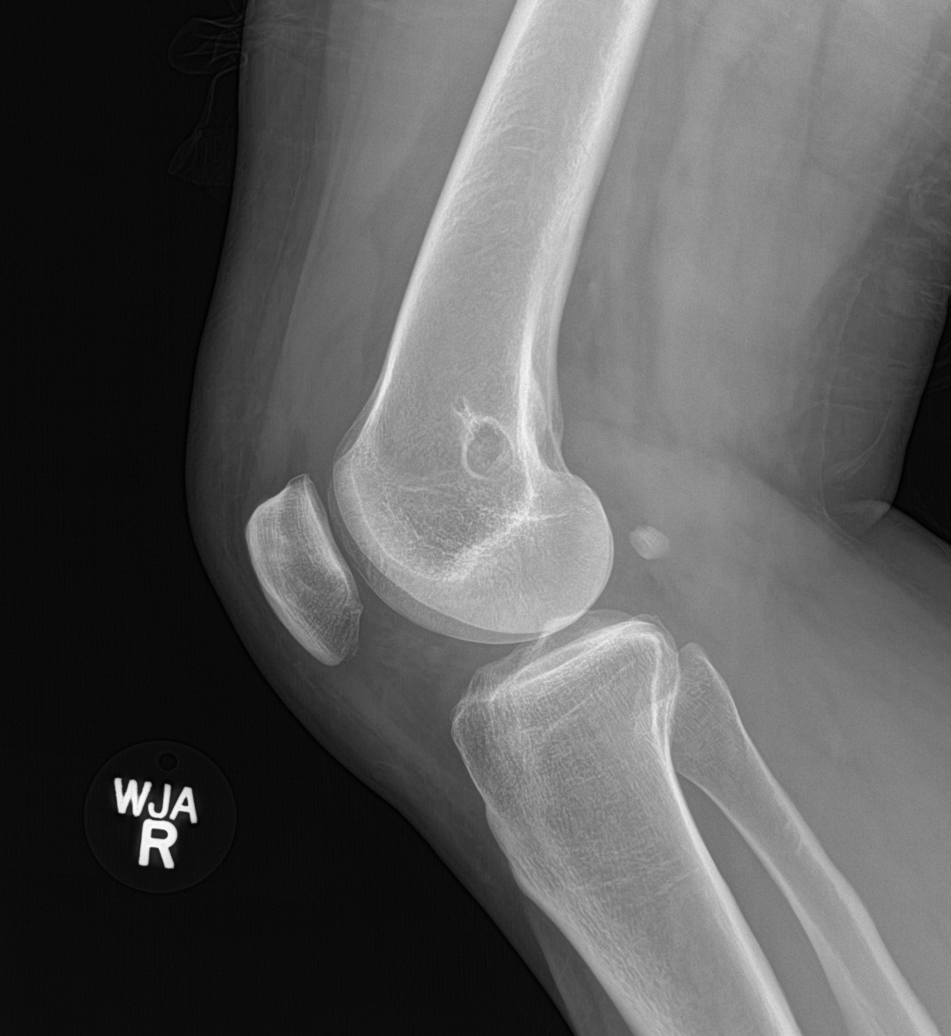

[knee obl (1 of 2)]
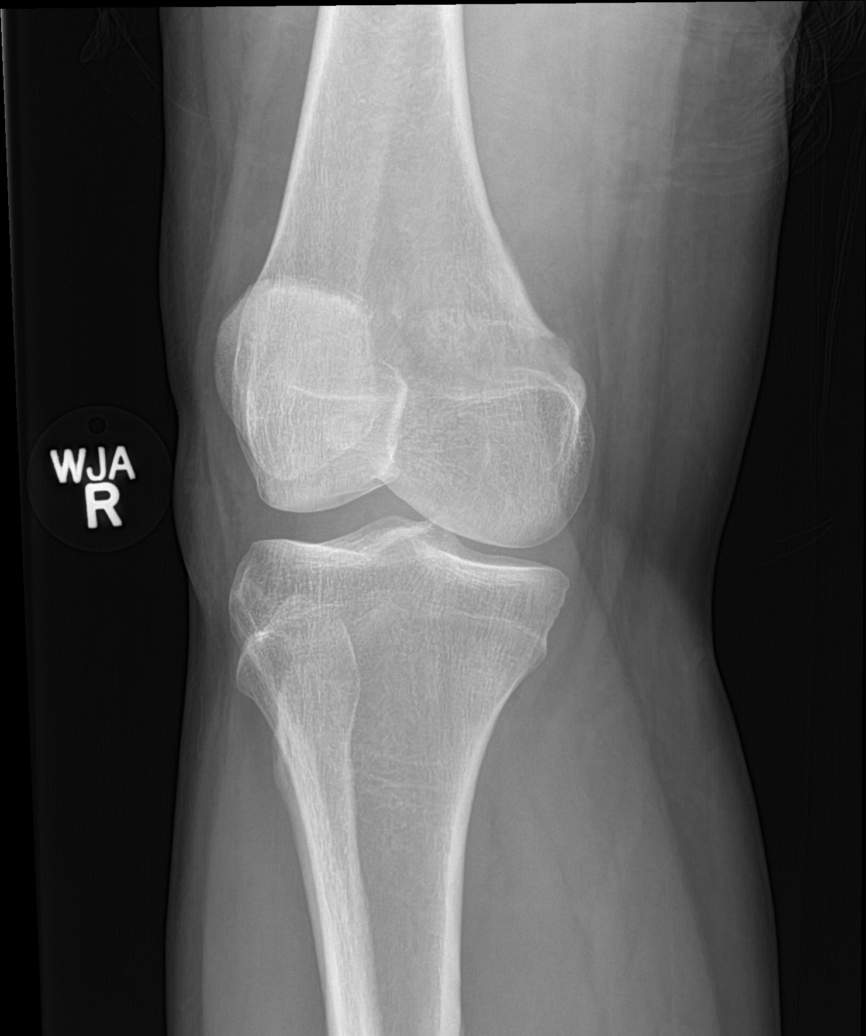

[knee obl (2 of 2)]
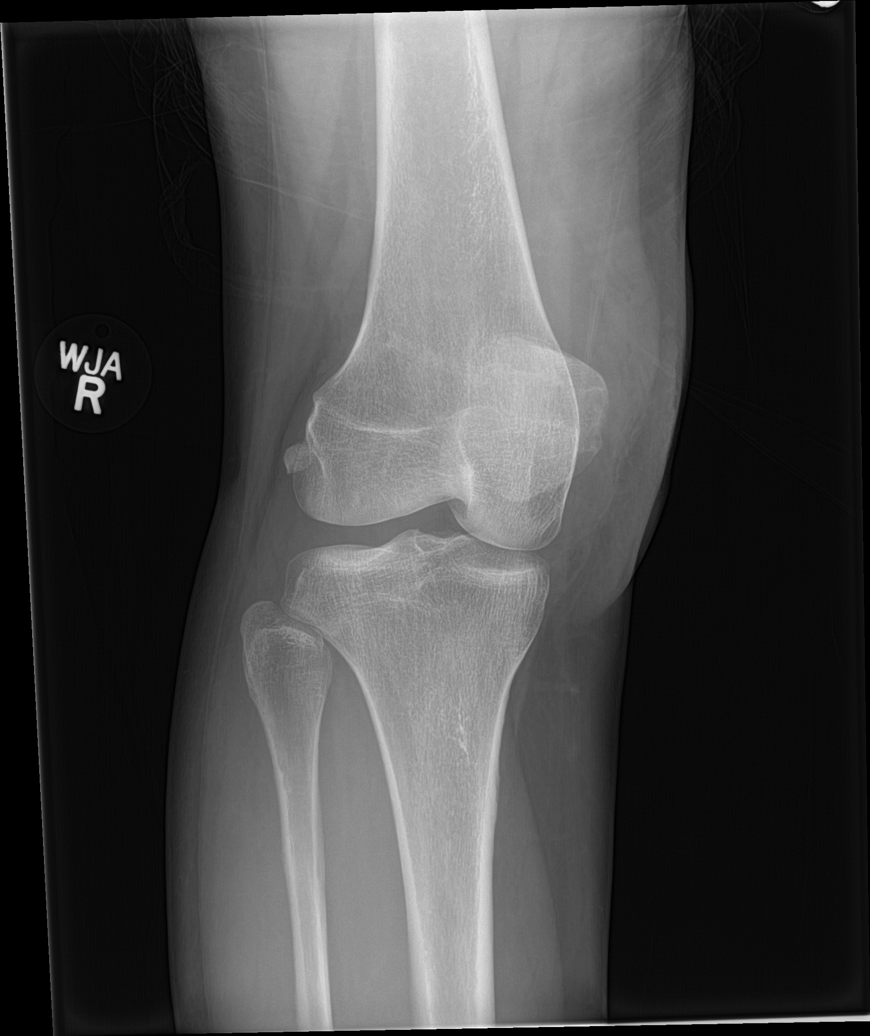

[4 of 4 positions shown; findings below may reference images not displayed]

FINDINGS: Negative for acute fracture dislocation. There is a knee joint
effusion. There is no bone lesion or bony destruction. No
significant arthritic changes are evident.
IMPRESSION: Joint effusion.  Negative for acute fracture.
